# Patient Record
Sex: Male | Born: 1995 | Race: White | Hispanic: No | Marital: Single | Smoking: Never smoker
Health system: Southern US, Community
[De-identification: ages and names within clinical notes are randomized; demographics above are authoritative.]

---
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
## 2017-08-21 ENCOUNTER — Emergency Department (HOSPITAL_BASED_OUTPATIENT_CLINIC_OR_DEPARTMENT_OTHER)
Admission: EM | Admit: 2017-08-21 | Discharge: 2017-08-21 | Disposition: A | Discharge status: Home / Self Care | Payer: Self-pay | Attending: Emergency Medicine | Admitting: Emergency Medicine

## 2017-08-21 ENCOUNTER — Other Ambulatory Visit: Payer: Self-pay

## 2017-08-21 ENCOUNTER — Encounter (HOSPITAL_BASED_OUTPATIENT_CLINIC_OR_DEPARTMENT_OTHER): Payer: Self-pay | Admitting: Emergency Medicine

## 2017-08-21 DIAGNOSIS — J029 Acute pharyngitis, unspecified: Secondary | ICD-10-CM | POA: Insufficient documentation

## 2017-08-21 DIAGNOSIS — Z79899 Other long term (current) drug therapy: Secondary | ICD-10-CM | POA: Insufficient documentation

## 2017-08-21 LAB — RAPID STREP SCREEN (MED CTR MEBANE ONLY): Streptococcus, Group A Screen (Direct): NEGATIVE

## 2017-08-21 NOTE — ED Provider Notes (Signed)
   [facility] Provider Note: Lowella DellJ. Lane [name], MD, FACEP  CSN: [id number] MRN: [id number] ARRIVAL: 08/21/17 at 0127 ROOM: [facility]07/[facility]07   CHIEF COMPLAINT  Sore Throat   HISTORY OF PRESENT ILLNESS  08/21/17 3:07 AM Lee Thompson is a 22 y.o. male with a sore throat for 3 days.  He describes it as scratchy.  It is a 6 out of 10, worse with swallowing.  He denies associated fever.  He did have hoarseness for 2 days but that has resolved.  He denies cold symptoms such as nasal congestion, rhinorrhea, sneezing, cough or shortness of breath.  He denies nausea, vomiting and diarrhea.   Past Medical History:  Diagnosis Date  . Attention deficit disorder     History reviewed. No pertinent surgical history.  No family history on file.  Social History   Tobacco Use  . Smoking status: Never Smoker  Substance Use Topics  . Alcohol use: No  . Drug use: No    Prior to Admission medications   Medication Sig Start Date End Date Taking? Authorizing Provider  methylphenidate (CONCERTA) 54 MG CR tablet Take 54 mg by mouth every morning.    [provider]    Allergies Patient has no known allergies.   REVIEW OF SYSTEMS  Negative except as noted here or in the History of Present Illness.   PHYSICAL EXAMINATION  Initial Vital Signs Blood pressure 137/83, pulse 85, temperature 98.2 F (36.8 C), temperature source Oral, resp. rate 16, SpO2 99 %.  Examination General: Well-developed, well-nourished male in no acute distress; appearance consistent with age of record HENT: normocephalic; atraumatic; no pharyngeal erythema or exudate; no dysphonia Eyes: pupils equal, round and reactive to light; extraocular muscles intact Neck: supple; no lymphadenopathy Heart: regular rate and rhythm Lungs: clear to auscultation bilaterally Abdomen: soft; nondistended; nontender; bowel sounds present Extremities: No deformity; full range of motion; pulses normal Neurologic: Awake,  alert and oriented; motor function intact in all extremities and symmetric; no facial droop Skin: Warm and dry Psychiatric: Normal mood and affect   RESULTS  Summary of this visit's results, reviewed by myself:   EKG Interpretation  Date/Time:    Ventricular Rate:    PR Interval:    QRS Duration:   QT Interval:    QTC Calculation:   R Axis:     Text Interpretation:        Laboratory Studies: Results for orders placed or performed during the hospital encounter of 08/21/17 (from the past 24 hour(s))  Rapid strep screen     Status: None   Collection Time: 08/21/17  1:39 AM  Result Value Ref Range   Streptococcus, Group A Screen (Direct) NEGATIVE NEGATIVE   Imaging Studies: No results found.  ED COURSE  Nursing notes and initial vitals signs, including pulse oximetry, reviewed.  Vitals:   08/21/17 0136  BP: 137/83  Pulse: 85  Resp: 16  Temp: 98.2 F (36.8 C)  TempSrc: Oral  SpO2: 99%   Clinical presentation consistent with viral pharyngitis.  Patient declines steroids.  PROCEDURES    ED DIAGNOSES     ICD-10-CM   1. Viral pharyngitis J02.9        Luree Palla, MD 08/21/17 [phone]

## 2017-08-21 NOTE — ED Triage Notes (Signed)
Pt with sore throat x 3 days, denies fever

## 2017-08-21 NOTE — ED Notes (Signed)
ED Provider at bedside. 

## 2017-08-23 LAB — CULTURE, GROUP A STREP (THRC)

## 2021-09-27 ENCOUNTER — Other Ambulatory Visit: Payer: Self-pay

## 2021-09-27 ENCOUNTER — Encounter (HOSPITAL_BASED_OUTPATIENT_CLINIC_OR_DEPARTMENT_OTHER): Payer: Self-pay

## 2021-09-27 ENCOUNTER — Emergency Department (HOSPITAL_BASED_OUTPATIENT_CLINIC_OR_DEPARTMENT_OTHER)
Admission: EM | Admit: 2021-09-27 | Discharge: 2021-09-27 | Disposition: A | Discharge status: Home / Self Care | Payer: Managed Care, Other (non HMO) | Attending: Emergency Medicine | Admitting: Emergency Medicine

## 2021-09-27 DIAGNOSIS — L03316 Cellulitis of umbilicus: Secondary | ICD-10-CM | POA: Diagnosis not present

## 2021-09-27 DIAGNOSIS — R21 Rash and other nonspecific skin eruption: Secondary | ICD-10-CM | POA: Diagnosis present

## 2021-09-27 DIAGNOSIS — L0882 Omphalitis not of newborn: Secondary | ICD-10-CM | POA: Diagnosis not present

## 2021-09-27 MED ORDER — CEPHALEXIN 500 MG PO CAPS: 500.0000 mg | ORAL_CAPSULE | Freq: Two times a day (BID) | ORAL | 0 refills | Status: AC

## 2021-09-27 MED ORDER — CEPHALEXIN 250 MG PO CAPS
500.0000 mg | ORAL_CAPSULE | Freq: Once | ORAL | Status: AC
  Administered 2021-09-27: 500 mg via ORAL
  Filled 2021-09-27: qty 2

## 2021-09-27 NOTE — ED Provider Notes (Signed)
?  [facility] EM[facility]ENCY DEPT ?Provider Note ? ? ?CSN: [id number] ?Arrival date & time: 09/27/21  1916 ? ?  ? ?History ? ?Chief Complaint  ?Patient presents with  ? Rash  ? ? ?Lee Thompson is a 26 y.o. male with rash for 2 weeks, that began near the umbilicus and now spread in a streak to the lower abdomen.  No fevers or chills.  No history of cellulitis he is aware of her MRSA.  No other medical issues.  No bowel problem ? ?HPI ? ?  ? ?Home Medications ?Prior to Admission medications   ?Medication Sig Start Date End Date Taking? Authorizing Provider  ?cephALEXin (KEFLEX) 500 MG capsule Take 1 capsule (500 mg total) by mouth 2 (two) times daily for 10 days. 09/28/21 10/08/21 Yes Jayceon Troy, Kermit Balo, MD  ?methylphenidate (CONCERTA) 54 MG CR tablet Take 54 mg by mouth every morning.    [provider]  ?   ? ?Allergies    ?Patient has no known allergies.   ? ?Review of Systems   ?Review of Systems ? ?Physical Exam ?Updated Vital Signs ?BP (!) 145/88 (BP Location: Right Arm)   Pulse 92   Temp 99.2 ?F (37.3 ?C) (Oral)   Resp 12   Ht 6\' 1"  (1.854 m)   Wt 108 kg   SpO2 97%   BMI 31.40 kg/m?  ?Physical Exam ?Constitutional:   ?   General: He is not in acute distress. ?HENT:  ?   Head: Normocephalic and atraumatic.  ?Eyes:  ?   Conjunctiva/sclera: Conjunctivae normal.  ?   Pupils: Pupils are equal, round, and reactive to light.  ?Cardiovascular:  ?   Rate and Rhythm: Normal rate and regular rhythm.  ?Pulmonary:  ?   Effort: Pulmonary effort is normal. No respiratory distress.  ?Abdominal:  ?   General: There is no distension.  ?   Tenderness: There is no abdominal tenderness.  ?Skin: ?   General: Skin is warm and dry.  ?   Comments: Erythema and tenderness of the umbilicus with rash extending in a linear pattern to the lower navel  ?Neurological:  ?   Mental Status: He is alert.  ? ? ?ED Results / Procedures / Treatments   ?Labs ?(all labs ordered are listed, but only abnormal results are  displayed) ?Labs Reviewed - No data to display ? ?EKG ?None ? ?Radiology ?No results found. ? ?Procedures ?Procedures  ? ? ?Medications Ordered in ED ?Medications  ?cephALEXin (KEFLEX) capsule 500 mg (has no administration in time range)  ? ? ?ED Course/ Medical Decision Making/ A&P ?  ?                        ?Medical Decision Making ?Risk ?Prescription drug management. ? ? ?Clinical presentation is consistent with omphalitis.  Some mild surrounding cellulitis.  Doubt necrotizing fasciitis or deep space infection or incarcerated hernia given his clinical exam.  Will start on Keflex and discharge. ? ? ? ? ? ? ? ?Final Clinical Impression(s) / ED Diagnoses ?Final diagnoses:  ?Omphalitis in adult  ?Cellulitis of umbilicus  ? ? ?Rx / DC Orders ?ED Discharge Orders   ? ?      Ordered  ?  cephALEXin (KEFLEX) 500 MG capsule  2 times daily       ? 09/27/21 1953  ? ?  ?  ? ?  ? ? ?  ?09/29/21, MD ?09/27/21 1953 ? ?

## 2021-09-27 NOTE — ED Triage Notes (Signed)
Patient here POV from Home with Rash. ? ?Noted Redness to Mid Lower ABD for approximately 2 weeks. Believes it has become better over the two weeks but became concerned when patient used a Q-Tip to [employer] and it began to bleed today ? ?No Fevers. No N/V/D. ? ?NAD Noted during Triage. A&Ox4. GCS 15.  ?

## 2023-03-30 ENCOUNTER — Emergency Department (HOSPITAL_BASED_OUTPATIENT_CLINIC_OR_DEPARTMENT_OTHER)
Admission: EM | Admit: 2023-03-30 | Discharge: 2023-03-30 | Disposition: A | Discharge status: Home / Self Care | Payer: Medicaid Other | Attending: Emergency Medicine | Admitting: Emergency Medicine

## 2023-03-30 ENCOUNTER — Encounter (HOSPITAL_BASED_OUTPATIENT_CLINIC_OR_DEPARTMENT_OTHER): Payer: Self-pay

## 2023-03-30 ENCOUNTER — Other Ambulatory Visit: Payer: Self-pay

## 2023-03-30 ENCOUNTER — Emergency Department (HOSPITAL_BASED_OUTPATIENT_CLINIC_OR_DEPARTMENT_OTHER): Payer: Medicaid Other

## 2023-03-30 DIAGNOSIS — S0990XA Unspecified injury of head, initial encounter: Secondary | ICD-10-CM | POA: Insufficient documentation

## 2023-03-30 DIAGNOSIS — W2104XA Struck by golf ball, initial encounter: Secondary | ICD-10-CM | POA: Diagnosis not present

## 2023-03-30 MED ORDER — ACETAMINOPHEN 500 MG PO TABS
1000.0000 mg | ORAL_TABLET | Freq: Once | ORAL | Status: AC
  Administered 2023-03-30: 1000 mg via ORAL
  Filled 2023-03-30: qty 2

## 2023-03-30 NOTE — ED Triage Notes (Signed)
The patient was hit in the face with a  golf ball today. No LOC. No blood thinner use. She is having right side facial pain and swelling and fatigue.

## 2023-03-30 NOTE — ED Provider Notes (Signed)
[place] [facility]Y DEPARTMENT AT [facility] Provider Note   CSN: [id number] Arrival date & time: 03/30/23  1509     History Chief Complaint  Patient presents with   Head Injury    HPI Lee Thompson is a 27 y.o. male presenting for head injury.  States that he was hitting golf balls out of his backyard and 1 bounced off of the flagpole 10 yards from him and redirected back at his head he managed to turn his face and he was struck on the superior aspect of his right temporal bone.  Immediate headache followed by hematoma at the site.  Now he states he feels groggy but denies any other symptoms.  No medical problems denies fevers chills nausea vomiting shortness of breath.  Injury happened approximately 1 hour prior to arrival.  Patient's recorded medical, surgical, social, medication list and allergies were reviewed in the Snapshot window as part of the initial history.   Review of Systems   Review of Systems  Constitutional:  Negative for chills and fever.  HENT:  Negative for ear pain and sore throat.   Eyes:  Negative for pain and visual disturbance.  Respiratory:  Negative for cough and shortness of breath.   Cardiovascular:  Negative for chest pain and palpitations.  Gastrointestinal:  Negative for abdominal pain and vomiting.  Genitourinary:  Negative for dysuria and hematuria.  Musculoskeletal:  Negative for arthralgias and back pain.  Skin:  Negative for color change and rash.  Neurological:  Positive for headaches. Negative for seizures and syncope.  All other systems reviewed and are negative.   Physical Exam Updated Vital Signs BP (!) 126/92   Pulse 75   Temp (!) 96.5 F (35.8 C)   Resp 16   Ht 6' (1.829 m)   Wt 116.1 kg   SpO2 95%   BMI 34.72 kg/m  Physical Exam Vitals and nursing note reviewed.  Constitutional:      General: He is not in acute distress.    Appearance: He is well-developed.  HENT:     Head: Normocephalic and atraumatic.   Eyes:     Conjunctiva/sclera: Conjunctivae normal.  Cardiovascular:     Rate and Rhythm: Normal rate and regular rhythm.     Heart sounds: No murmur heard. Pulmonary:     Effort: Pulmonary effort is normal. No respiratory distress.     Breath sounds: Normal breath sounds.  Abdominal:     Palpations: Abdomen is soft.     Tenderness: There is no abdominal tenderness.  Musculoskeletal:        General: Tenderness (1 cm hematoma over right anterior superior temporal bone.  No crepitus or bony deformity) present. No swelling.     Cervical back: Neck supple.  Skin:    General: Skin is warm and dry.     Capillary Refill: Capillary refill takes less than 2 seconds.  Neurological:     Mental Status: He is alert.  Psychiatric:        Mood and Affect: Mood normal.      ED Course/ Medical Decision Making/ A&P    Procedures Procedures   Medications Ordered in ED Medications  acetaminophen (TYLENOL) tablet 1,000 mg (1,000 mg Oral Given 03/30/23 1554)   Medical Decision Making:    Lee Thompson is a 27 y.o. male who presented to the ED today with a moderate mechanisma trauma, detailed above.     Given this mechanism of trauma, a full physical exam was  performed. Notably, patient was HDS in NAD.   Reviewed and confirmed nursing documentation for past medical history, family history, social history.    Initial Assessment/Plan:   This is a patient presenting with a moderate mechanism trauma.  As such, I have considered intracranial injuries including intracranial hemorrhage, intrathoracic injuries including blunt myocardial or blunt lung injury, blunt abdominal injuries including aortic dissection, bladder injury, spleen injury, liver injury and I have considered orthopedic injuries including extremity or spinal injury.  With the patient's presentation of moderate mechanism trauma but an otherwise reassuring exam, patient warrants targeted evaluation for potential traumatic injuries.  Will proceed with targeted evaluation for potential injuries. Will proceed with CT head.   Final Reassessment and Plan:   No acute pathology on CT head.  Observed in emergency department for 2 hours mentally at baseline. He is well-appearing stable for outpatient care management at this time likely developing concussive syndrome without acute traumatic brain injury.  Disposition:  I have considered need for hospitalization, however, considering all of the above, I believe this patient is stable for discharge at this time.  Patient/family educated about specific return precautions for given chief complaint and symptoms.  Patient/family educated about follow-up with PCP.      Patient/family expressed understanding of return precautions and need for follow-up. Patient spoken to regarding all imaging and laboratory results and appropriate follow up for these results. All education provided in verbal form with additional information in written form. Time was allowed for answering of patient questions. Patient discharged.    Emergency Department Medication Summary:   Medications  acetaminophen (TYLENOL) tablet 1,000 mg (1,000 mg Oral Given 03/30/23 1554)           Clinical Impression:  1. Injury of head, initial encounter      Discharge   Final Clinical Impression(s) / ED Diagnoses Final diagnoses:  Injury of head, initial encounter    Rx / DC Orders ED Discharge Orders     None         Glyn Ade, MD 03/30/23 2243
